# Patient Record
Sex: Female | Born: 2000
Health system: Southern US, Community
[De-identification: ages and names within clinical notes are randomized; demographics above are authoritative.]

---
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
## 2019-01-29 IMAGING — CR DG CHEST 2V
3 series · 3 of 3 positions shown · non-contrast
Comparison: None.

CLINICAL DATA: Coughing for 4 weeks.  Shortness of breath.

EXAM:
CHEST - 2 VIEW

[chest pa (1 of 2)]
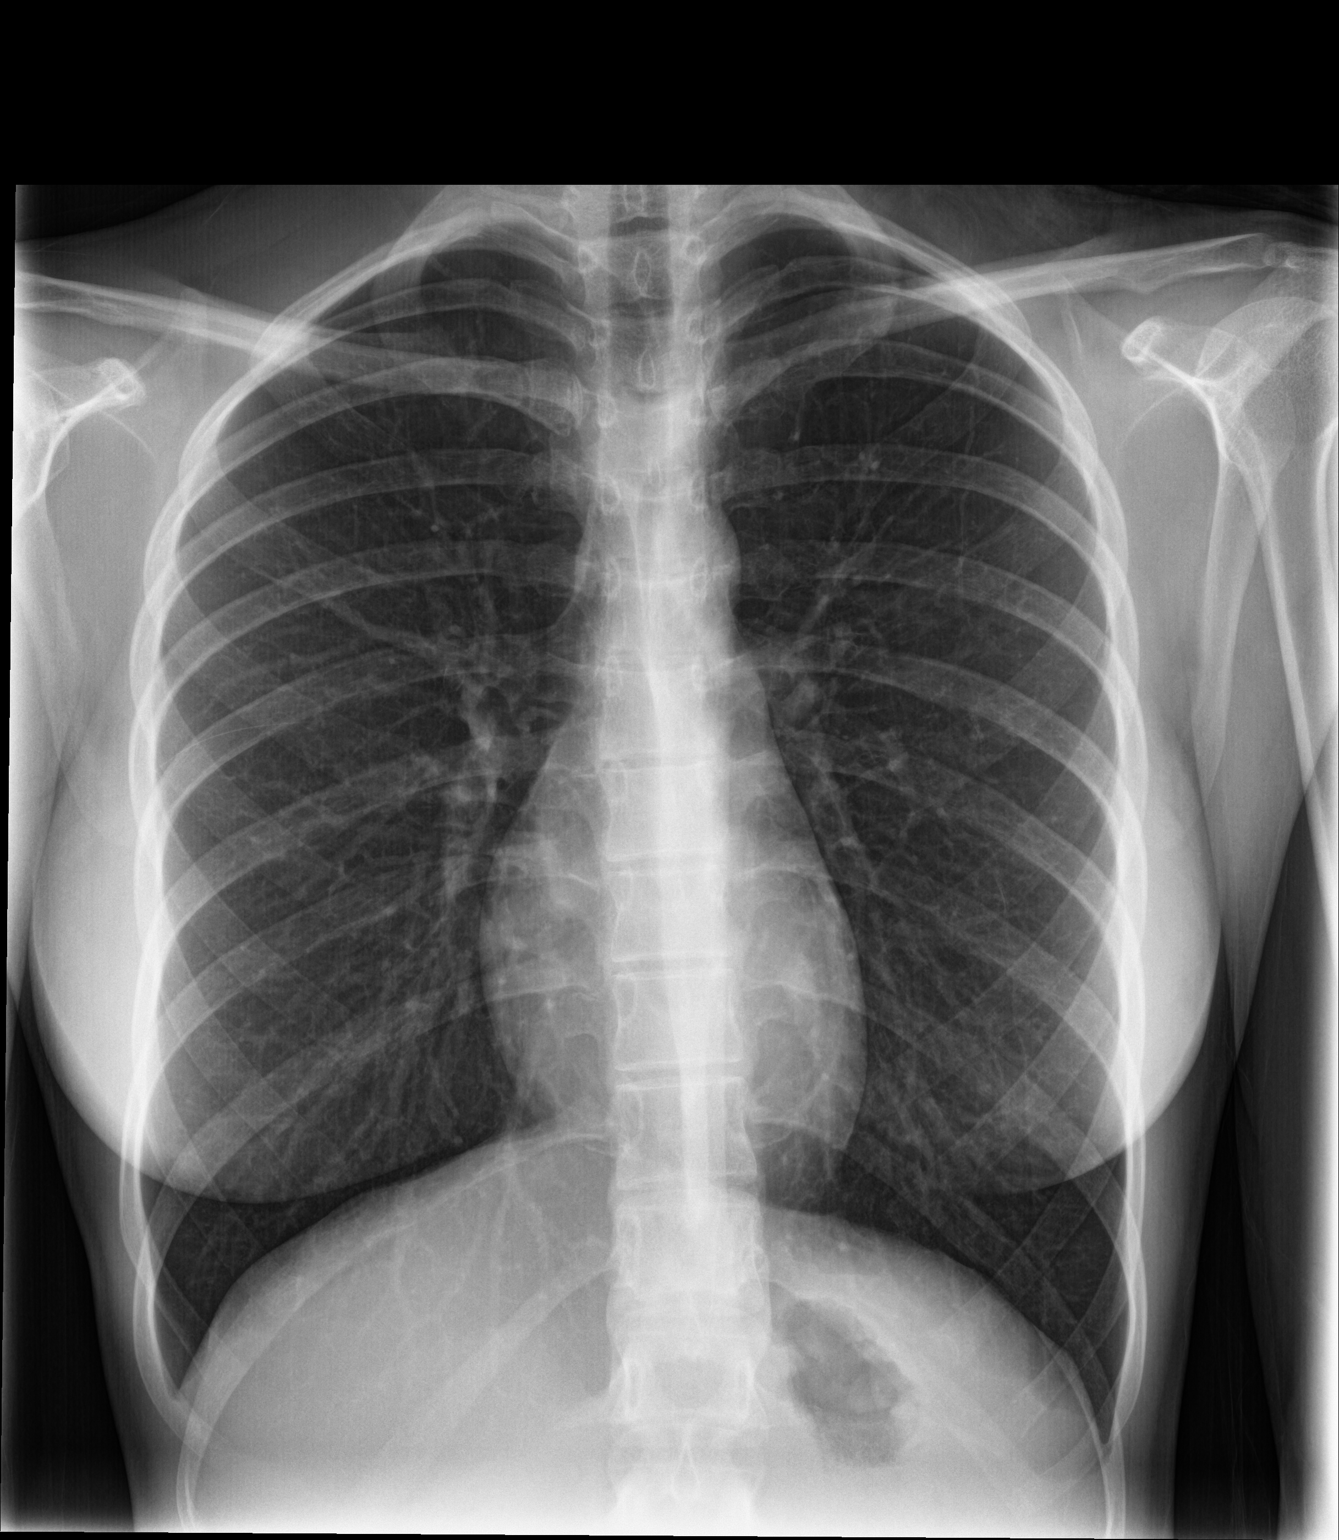

[chest lat]
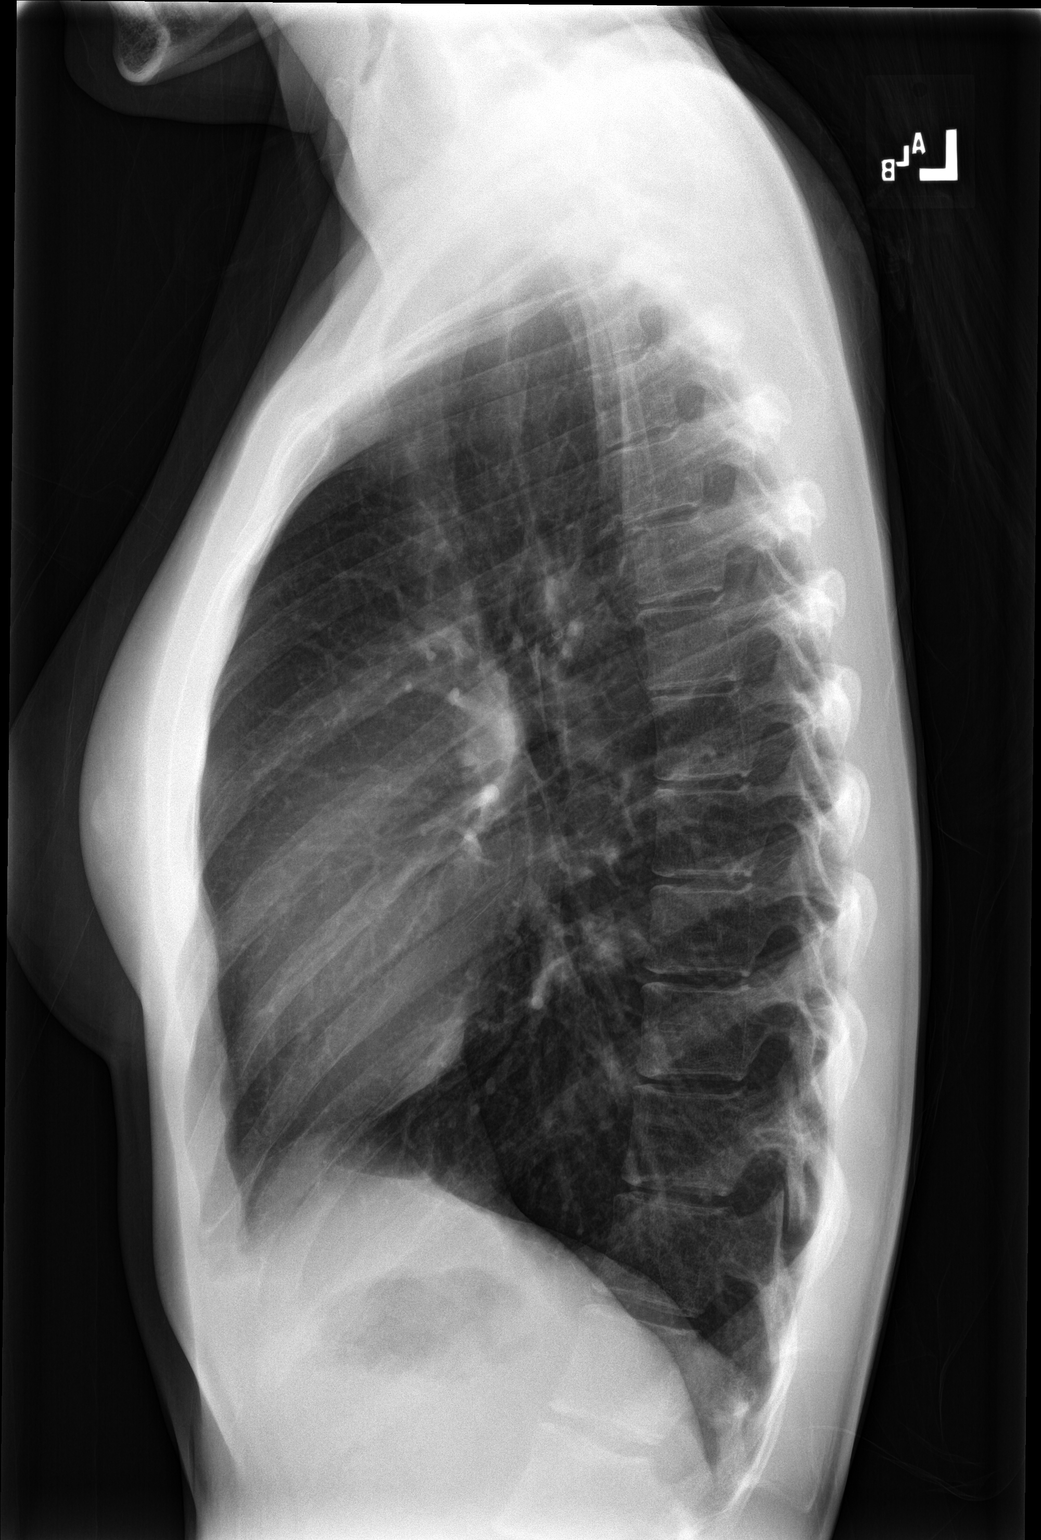

[chest pa (2 of 2)]
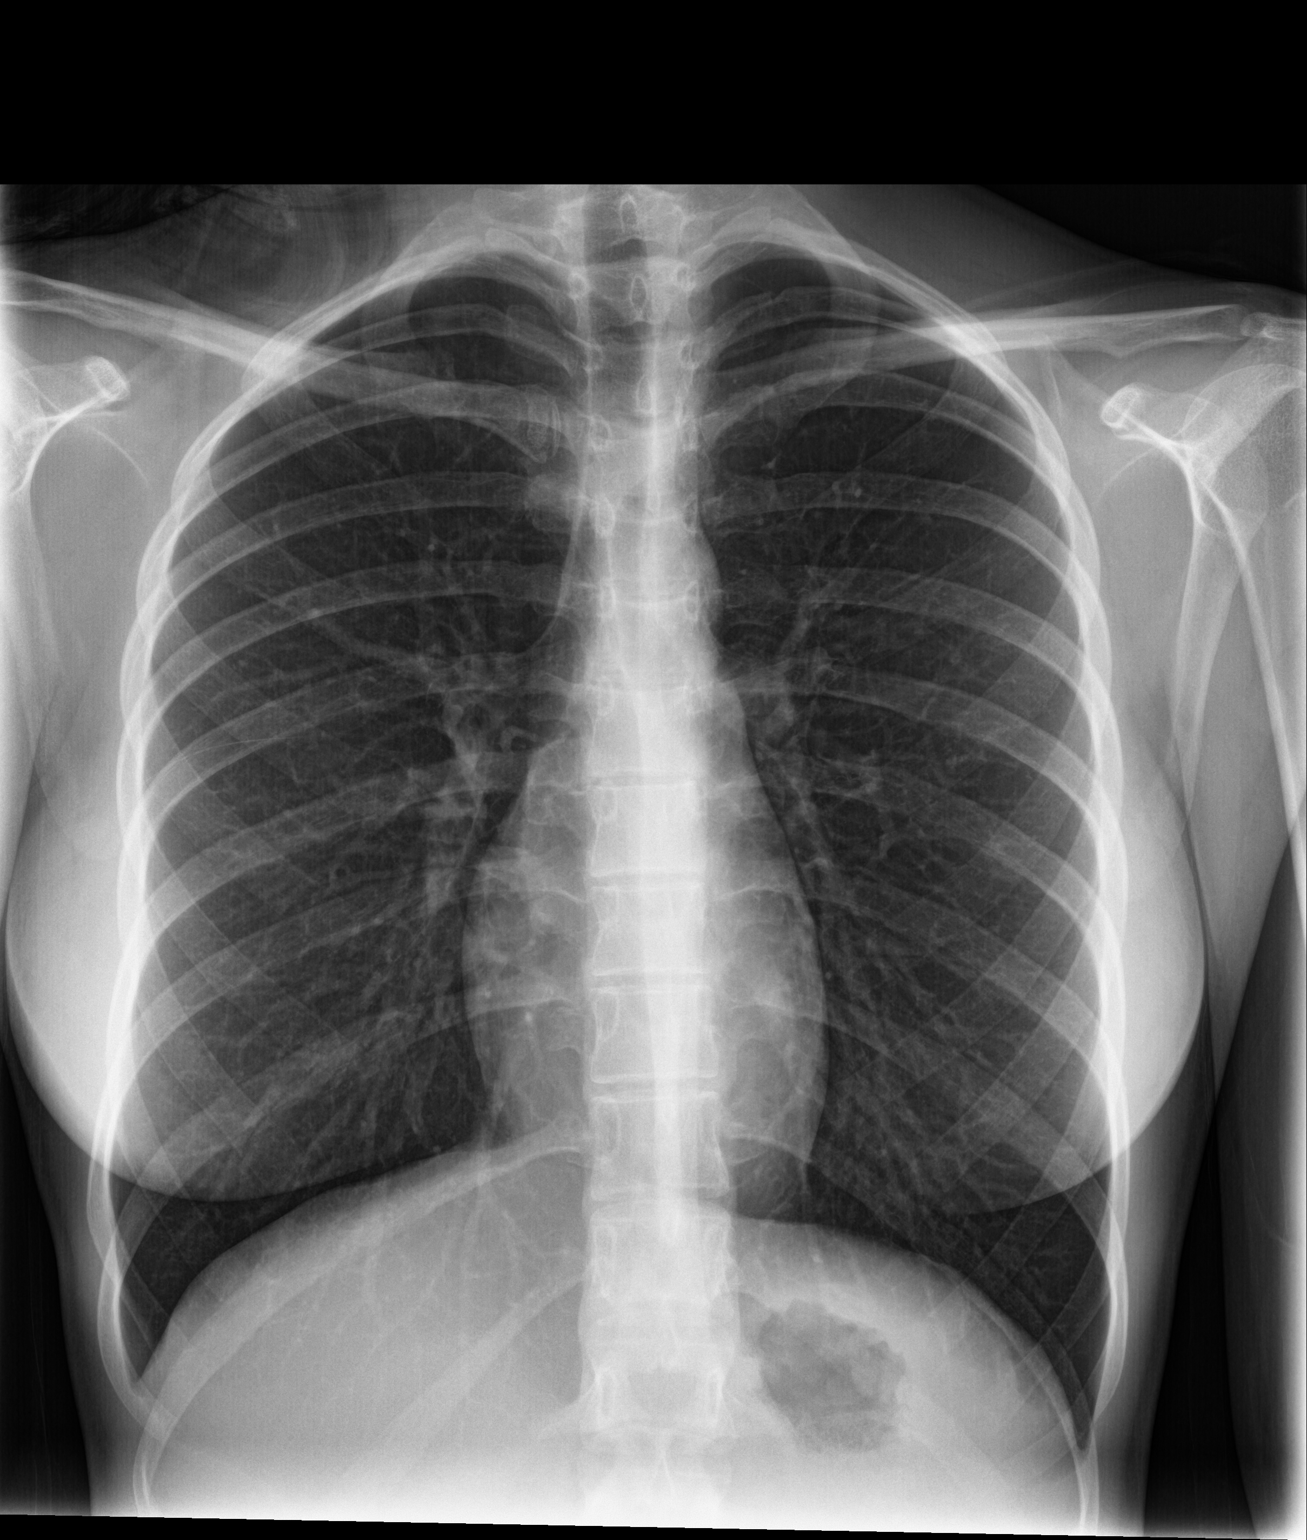

[3 of 3 positions shown; findings below may reference images not displayed]

FINDINGS: The heart size and mediastinal contours are within normal limits.
Both lungs are clear. The visualized skeletal structures are
unremarkable.
IMPRESSION: No active cardiopulmonary disease.

## 2020-08-27 ENCOUNTER — Ambulatory Visit (INDEPENDENT_AMBULATORY_CARE_PROVIDER_SITE_OTHER): Payer: BLUE CROSS/BLUE SHIELD | Admitting: Dermatology

## 2020-08-27 ENCOUNTER — Other Ambulatory Visit: Payer: Self-pay

## 2020-08-27 DIAGNOSIS — Z86018 Personal history of other benign neoplasm: Secondary | ICD-10-CM | POA: Diagnosis not present

## 2020-08-27 DIAGNOSIS — D229 Melanocytic nevi, unspecified: Secondary | ICD-10-CM

## 2020-08-27 DIAGNOSIS — Z1283 Encounter for screening for malignant neoplasm of skin: Secondary | ICD-10-CM | POA: Diagnosis not present

## 2020-08-27 DIAGNOSIS — L578 Other skin changes due to chronic exposure to nonionizing radiation: Secondary | ICD-10-CM

## 2020-08-27 DIAGNOSIS — L700 Acne vulgaris: Secondary | ICD-10-CM | POA: Diagnosis not present

## 2020-08-27 NOTE — Progress Notes (Signed)
   Follow-Up Visit   Subjective  Jacqueline Pruitt is a 19 y.o. female who presents for the following: TBSE (Total body skin exam, hx of dysplastic nevi) and Acne (Face, Dapsone 7.5% and Veltin prn). The patient presents for Total-Body Skin Exam (TBSE) for skin cancer screening and mole check.  The following portions of the chart were reviewed this encounter and updated as appropriate:   Tobacco  Allergies  Meds  Problems  Med Hx  Surg Hx  Fam Hx     Review of Systems:  No other skin or systemic complaints except as noted in HPI or Assessment and Plan.  Objective  Well appearing patient in no apparent distress; mood and affect are within normal limits.  A full examination was performed including scalp, head, eyes, ears, nose, lips, neck, chest, axillae, abdomen, back, buttocks, bilateral upper extremities, bilateral lower extremities, hands, feet, fingers, toes, fingernails, and toenails. All findings within normal limits unless otherwise noted below.  Objective  face: Few hyperpigmented macules scattered over lower face, moderate inflamed comedones over shoulders and back   Assessment & Plan    History of Dysplastic Nevi - No evidence of recurrence today - Recommend regular full body skin exams - Recommend daily broad spectrum sunscreen SPF 30+ to sun-exposed areas, reapply every 2 hours as needed.  - Call if any new or changing lesions are noted between office visits  Melanocytic Nevi - Tan-brown and/or pink-flesh-colored symmetric macules and papules - Benign appearing on exam today - Observation - Call clinic for new or changing moles - Recommend daily use of broad spectrum spf 30+ sunscreen to sun-exposed areas.   Actinic Damage - Chronic, secondary to cumulative UV/sun exposure - diffuse scaly erythematous macules with underlying dyspigmentation - Recommend daily broad spectrum sunscreen SPF 30+ to sun-exposed areas, reapply every 2 hours as needed.  - Call for  new or changing lesions.  Skin cancer screening performed today.  Acne vulgaris face Chronic; Persistent, not to goal  Recommend Winlevi qhs, samples given Lot [id number] 08/2021 Cont Veltin or Tretinoin qhs Cont Dapsone 7.5% gel qd  Return in about 1 year (around 08/27/2021) for TBSE, Hx of Dysplastic nevi.  I, Othelia Pulling, RMA, am acting as scribe for Sarina Ser, MD .  Documentation: I have reviewed the above documentation for accuracy and completeness, and I agree with the above.  Sarina Ser, MD

## 2020-08-27 NOTE — Patient Instructions (Signed)
Acne treatment for face and back  Start Winlevi pea size amount at night Continue Veltin alternating with Tretinoin pea size amount at night Continue Dapsone 7.5% gel in the morning

## 2020-09-01 ENCOUNTER — Encounter: Payer: Self-pay | Admitting: Dermatology

## 2020-09-17 ENCOUNTER — Encounter: Payer: 59 | Admitting: Dermatology

## 2021-09-01 ENCOUNTER — Other Ambulatory Visit: Payer: Self-pay

## 2021-09-01 ENCOUNTER — Ambulatory Visit (INDEPENDENT_AMBULATORY_CARE_PROVIDER_SITE_OTHER): Payer: BC Managed Care – PPO | Admitting: Dermatology

## 2021-09-01 DIAGNOSIS — B360 Pityriasis versicolor: Secondary | ICD-10-CM

## 2021-09-01 DIAGNOSIS — L821 Other seborrheic keratosis: Secondary | ICD-10-CM

## 2021-09-01 DIAGNOSIS — D1800 Hemangioma unspecified site: Secondary | ICD-10-CM

## 2021-09-01 DIAGNOSIS — Z86018 Personal history of other benign neoplasm: Secondary | ICD-10-CM

## 2021-09-01 DIAGNOSIS — Z1283 Encounter for screening for malignant neoplasm of skin: Secondary | ICD-10-CM | POA: Diagnosis not present

## 2021-09-01 DIAGNOSIS — L578 Other skin changes due to chronic exposure to nonionizing radiation: Secondary | ICD-10-CM

## 2021-09-01 DIAGNOSIS — D229 Melanocytic nevi, unspecified: Secondary | ICD-10-CM

## 2021-09-01 DIAGNOSIS — L700 Acne vulgaris: Secondary | ICD-10-CM

## 2021-09-01 DIAGNOSIS — L814 Other melanin hyperpigmentation: Secondary | ICD-10-CM

## 2021-09-01 MED ORDER — KETOCONAZOLE 2 % EX SHAM: 1.0000 "application " | MEDICATED_SHAMPOO | Freq: Once | CUTANEOUS | 0 refills | Status: DC

## 2021-09-01 MED ORDER — DAPSONE 7.5 % EX GEL: 1.0000 "application " | Freq: Every day | CUTANEOUS | 11 refills | Status: DC

## 2021-09-01 MED ORDER — TRETINOIN 0.05 % EX CREA
TOPICAL_CREAM | Freq: Every day | CUTANEOUS | 11 refills | Status: DC
Start: 2021-09-01 — End: 2022-08-31

## 2021-09-01 MED ORDER — WINLEVI 1 % EX CREA: 1.0000 "application " | TOPICAL_CREAM | Freq: Every day | CUTANEOUS | 11 refills | Status: DC

## 2021-09-01 NOTE — Progress Notes (Signed)
Follow-Up Visit   Subjective  Jacqueline Pruitt is a 20 y.o. female who presents for the following: Annual Exam (History of dysplastic nevi - TBSE today). The patient presents for Total-Body Skin Exam (TBSE) for skin cancer screening and mole check.  The patient has spots, moles and lesions to be evaluated, some may be new or changing and the patient has concerns that these could be cancer. She has acne is currently using tretinoin and dapsone.  She has some persistent areas. She has a rash on her trunk.  The following portions of the chart were reviewed this encounter and updated as appropriate:   Tobacco   Allergies   Meds   Problems   Med Hx   Surg Hx   Fam Hx      Review of Systems:  No other skin or systemic complaints except as noted in HPI or Assessment and Plan.  Objective  Well appearing patient in no apparent distress; mood and affect are within normal limits.  A full examination was performed including scalp, head, eyes, ears, nose, lips, neck, chest, axillae, abdomen, back, buttocks, bilateral upper extremities, bilateral lower extremities, hands, feet, fingers, toes, fingernails, and toenails. All findings within normal limits unless otherwise noted below.  Head - Anterior (Face) ~10 small active papules of face. Mild inflamed comedones of shouders.   Assessment & Plan   History of Dysplastic Nevi - No evidence of recurrence today - Recommend regular full body skin exams - Recommend daily broad spectrum sunscreen SPF 30+ to sun-exposed areas, reapply every 2 hours as needed.  - Call if any new or changing lesions are noted between office visits  Lentigines - Scattered tan macules - Due to sun exposure - Benign-appearing, observe - Recommend daily broad spectrum sunscreen SPF 30+ to sun-exposed areas, reapply every 2 hours as needed. - Call for any changes  Seborrheic Keratoses - Stuck-on, waxy, tan-brown papules and/or plaques  - Benign-appearing - Discussed  benign etiology and prognosis. - Observe - Call for any changes  Melanocytic Nevi - Tan-brown and/or pink-flesh-colored symmetric macules and papules - Benign appearing on exam today - Observation - Call clinic for new or changing moles - Recommend daily use of broad spectrum spf 30+ sunscreen to sun-exposed areas.   Hemangiomas - Red papules - Discussed benign nature - Observe - Call for any changes  Actinic Damage - Chronic condition, secondary to cumulative UV/sun exposure - diffuse scaly erythematous macules with underlying dyspigmentation - Recommend daily broad spectrum sunscreen SPF 30+ to sun-exposed areas, reapply every 2 hours as needed.  - Staying in the shade or wearing long sleeves, sun glasses (UVA+UVB protection) and wide brim hats (4-inch brim around the entire circumference of the hat) are also recommended for sun protection.  - Call for new or changing lesions.  Skin cancer screening performed today.  Acne vulgaris Head - Anterior (Face) Chronic and persistent but improved on treatment.  Start Winlevi cream qhs Continue Dapsone gel qd,  Tretinoin qhs  tretinoin (RETIN-A) 0.05 % cream - Head - Anterior (Face) Apply topically at bedtime. Clascoterone (WINLEVI) 1 % CREA - Head - Anterior (Face) Apply 1 application topically daily. Dapsone 7.5 % GEL - Head - Anterior (Face) Apply 1 application topically daily.  Tinea versicolor Ketoconazole 2% shampoo - wash trunk 2-3 times per week for 2 months then monthly for prevention. Tinea versicolor is a chronic recurrent skin rash causing discolored scaly spots most commonly seen on back, chest, and/or shoulders.  It  is generally asymptomatic. The rash is due to overgrowth of a common type of yeast present on everyone's skin and it is not contagious.  It tends to flare more in the summer due to increased sweating on trunk.  After rash is treated, the scaliness will resolve, but the discoloration will take longer to  return to normal pigmentation. The periodic use of an OTC medicated soap/shampoo with zinc or selenium sulfide can be helpful to prevent yeast overgrowth and recurrence.  ketoconazole (NIZORAL) 2 % shampoo Apply 1 application topically once for 1 dose.  Skin cancer screening  Return in about 1 year (around 09/01/2022) for TBSE.  I, Ashok Cordia, CMA, am acting as scribe for Sarina Ser, MD . Documentation: I have reviewed the above documentation for accuracy and completeness, and I agree with the above.  Sarina Ser, MD

## 2021-09-01 NOTE — Patient Instructions (Signed)

## 2021-09-12 ENCOUNTER — Encounter: Payer: Self-pay | Admitting: Dermatology

## 2021-10-01 ENCOUNTER — Other Ambulatory Visit: Payer: Self-pay | Admitting: Dermatology

## 2021-10-01 DIAGNOSIS — B360 Pityriasis versicolor: Secondary | ICD-10-CM

## 2022-08-31 ENCOUNTER — Ambulatory Visit (INDEPENDENT_AMBULATORY_CARE_PROVIDER_SITE_OTHER): Payer: BC Managed Care – PPO | Admitting: Dermatology

## 2022-08-31 DIAGNOSIS — L821 Other seborrheic keratosis: Secondary | ICD-10-CM

## 2022-08-31 DIAGNOSIS — Z1283 Encounter for screening for malignant neoplasm of skin: Secondary | ICD-10-CM | POA: Diagnosis not present

## 2022-08-31 DIAGNOSIS — L700 Acne vulgaris: Secondary | ICD-10-CM | POA: Diagnosis not present

## 2022-08-31 DIAGNOSIS — D492 Neoplasm of unspecified behavior of bone, soft tissue, and skin: Secondary | ICD-10-CM

## 2022-08-31 DIAGNOSIS — D225 Melanocytic nevi of trunk: Secondary | ICD-10-CM | POA: Diagnosis not present

## 2022-08-31 DIAGNOSIS — Z79899 Other long term (current) drug therapy: Secondary | ICD-10-CM

## 2022-08-31 DIAGNOSIS — Z86018 Personal history of other benign neoplasm: Secondary | ICD-10-CM

## 2022-08-31 DIAGNOSIS — L814 Other melanin hyperpigmentation: Secondary | ICD-10-CM

## 2022-08-31 MED ORDER — TRETINOIN 0.05 % EX CREA: TOPICAL_CREAM | Freq: Every day | CUTANEOUS | 11 refills | Status: DC

## 2022-08-31 MED ORDER — DAPSONE 7.5 % EX GEL: 1.0000 "application " | Freq: Every day | CUTANEOUS | 11 refills | Status: DC

## 2022-08-31 NOTE — Patient Instructions (Signed)
Due to recent changes in healthcare laws, you may see results of your pathology and/or laboratory studies on MyChart before the doctors have had a chance to review them. We understand that in some cases there may be results that are confusing or concerning to you. Please understand that not all results are received at the same time and often the doctors may need to interpret multiple results in order to provide you with the best plan of care or course of treatment. Therefore, we ask that you please give us 2 business days to thoroughly review all your results before contacting the office for clarification. Should we see a critical lab result, you will be contacted sooner.   If You Need Anything After Your Visit  If you have any questions or concerns for your doctor, please call our main line at [phone] and press option 4 to reach your doctor's medical assistant. If no one answers, please leave a voicemail as directed and we will return your call as soon as possible. Messages left after 4 pm will be answered the following business day.   You may also send us a message via MyChart. We typically respond to MyChart messages within 1-2 business days.  For prescription refills, please ask your pharmacy to contact our office. Our fax number is [phone].  If you have an urgent issue when the clinic is closed that cannot wait until the next business day, you can page your doctor at the number below.    Please note that while we do our best to be available for urgent issues outside of office hours, we are not available 24/7.   If you have an urgent issue and are unable to reach us, you may choose to seek medical care at your doctor's office, retail clinic, urgent care center, or emergency room.  If you have a medical emergency, please immediately call 911 or go to the emergency department.  Pager Numbers  - Dr. Kowalski: [phone]  - Dr. Moye: [phone]  - Dr. Stewart:  [phone]  In the event of inclement weather, please call our main line at [phone] for an update on the status of any delays or closures.  Dermatology Medication Tips: Please keep the boxes that topical medications come in in order to help keep track of the instructions about where and how to use these. Pharmacies typically print the medication instructions only on the boxes and not directly on the medication tubes.   If your medication is too expensive, please contact our office at [phone] option 4 or send us a message through MyChart.   We are unable to tell what your co-pay for medications will be in advance as this is different depending on your insurance coverage. However, we may be able to find a substitute medication at lower cost or fill out paperwork to get insurance to cover a needed medication.   If a prior authorization is required to get your medication covered by your insurance company, please allow us 1-2 business days to complete this process.  Drug prices often vary depending on where the prescription is filled and some pharmacies may offer cheaper prices.  The website www.goodrx.com contains coupons for medications through different pharmacies. The prices here do not account for what the cost may be with help from insurance (it may be cheaper with your insurance), but the website can give you the price if you did not use any insurance.  - You can print the associated coupon and take it with   your prescription to the pharmacy.  - You may also stop by our office during regular business hours and pick up a GoodRx coupon card.  - If you need your prescription sent electronically to a different pharmacy, notify our office through [facility] MyChart or by phone at [phone] option 4.     Si Usted Necesita Algo Despus de Su Visita  Tambin puede enviarnos un mensaje a travs de MyChart. Por lo general respondemos a los mensajes de MyChart en el transcurso de 1 a 2  das hbiles.  Para renovar recetas, por favor pida a su farmacia que se ponga en contacto con nuestra oficina. Nuestro nmero de fax es el [phone].  Si tiene un asunto urgente cuando la clnica est cerrada y que no puede esperar hasta el siguiente da hbil, puede llamar/localizar a su doctor(a) al nmero que aparece a continuacin.   Por favor, tenga en cuenta que aunque hacemos todo lo posible para estar disponibles para asuntos urgentes fuera del horario de oficina, no estamos disponibles las 24 horas del da, los 7 das de la semana.   Si tiene un problema urgente y no puede comunicarse con nosotros, puede optar por buscar atencin mdica  en el consultorio de su doctor(a), en una clnica privada, en un centro de atencin urgente o en una sala de emergencias.  Si tiene una emergencia mdica, por favor llame inmediatamente al 911 o vaya a la sala de emergencias.  Nmeros de bper  - Dr. Kowalski: [phone]  - Dra. Moye: [phone]  - Dra. Stewart: [phone]  En caso de inclemencias del tiempo, por favor llame a nuestra lnea principal al [phone] para una actualizacin sobre el estado de cualquier retraso o cierre.  Consejos para la medicacin en dermatologa: Por favor, guarde las cajas en las que vienen los medicamentos de uso tpico para ayudarle a seguir las instrucciones sobre dnde y cmo usarlos. Las farmacias generalmente imprimen las instrucciones del medicamento slo en las cajas y no directamente en los tubos del medicamento.   Si su medicamento es muy caro, por favor, pngase en contacto con nuestra oficina llamando al [phone] y presione la opcin 4 o envenos un mensaje a travs de MyChart.   No podemos decirle cul ser su copago por los medicamentos por adelantado ya que esto es diferente dependiendo de la cobertura de su seguro. Sin embargo, es posible que podamos encontrar un medicamento sustituto a menor costo o llenar un formulario para que el  seguro cubra el medicamento que se considera necesario.   Si se requiere una autorizacin previa para que su compaa de seguros cubra su medicamento, por favor permtanos de 1 a 2 das hbiles para completar este proceso.  Los precios de los medicamentos varan con frecuencia dependiendo del lugar de dnde se surte la receta y alguna farmacias pueden ofrecer precios ms baratos.  El sitio web www.goodrx.com tiene cupones para medicamentos de diferentes farmacias. Los precios aqu no tienen en cuenta lo que podra costar con la ayuda del seguro (puede ser ms barato con su seguro), pero el sitio web puede darle el precio si no utiliz ningn seguro.  - Puede imprimir el cupn correspondiente y llevarlo con su receta a la farmacia.  - Tambin puede pasar por nuestra oficina durante el horario de atencin regular y recoger una tarjeta de cupones de GoodRx.  - Si necesita que su receta se enve electrnicamente a una farmacia diferente, informe a nuestra oficina a travs de MyChart de [facility]   o por telfono llamando al [phone] y presione la opcin 4.  

## 2022-08-31 NOTE — Progress Notes (Signed)
Follow-Up Visit   Subjective  Jacqueline Pruitt is a 21 y.o. female who presents for the following: Annual Exam (Mole check ) and Acne (Recheck acne on her face treating with Dapsone and Retin A .05% cream ). The patient presents for Total-Body Skin Exam (TBSE) for skin cancer screening and mole check.  The patient has spots, moles and lesions to be evaluated, some may be new or changing and the patient has concerns that these could be cancer.   Mother with patient   The following portions of the chart were reviewed this encounter and updated as appropriate:   Tobacco  Allergies  Meds  Problems  Med Hx  Surg Hx  Fam Hx     Review of Systems:  No other skin or systemic complaints except as noted in HPI or Assessment and Plan.  Objective  Well appearing patient in no apparent distress; mood and affect are within normal limits.  A full examination was performed including scalp, head, eyes, ears, nose, lips, neck, chest, axillae, abdomen, back, buttocks, bilateral upper extremities, bilateral lower extremities, hands, feet, fingers, toes, fingernails, and toenails. All findings within normal limits unless otherwise noted below.  face Small scattered papules on the face and shoulders   left epigastic 0.6 cm irregular brown macule        right mid lateral  back at side 0.6 cm irregular brown macule         Assessment & Plan  Acne vulgaris face Chronic and persistent condition with duration or expected duration over one year. Condition is symptomatic / bothersome to patient. Not to goal. Continue Tretinoin 0.0.5% cream apply to face, upper back and shoulders  Continue Dapsone gel apply to face, upper back and shoulders  May consider low dose Doxycyline in the future -patient declines oral treatment today.  Related Medications tretinoin (RETIN-A) 0.05 % cream Apply topically at bedtime. Dapsone 7.5 % GEL Apply 1 application  topically daily.  Neoplasm of skin  (2) left epigastic Epidermal / dermal shaving  Lesion diameter (cm):  0.6 Informed consent: discussed and consent obtained   Timeout: patient name, date of birth, surgical site, and procedure verified   Procedure prep:  Patient was prepped and draped in usual sterile fashion Prep type:  Isopropyl alcohol Anesthesia: the lesion was anesthetized in a standard fashion   Anesthetic:  1% lidocaine w/ epinephrine 1-100,000 buffered w/ 8.4% NaHCO3 Hemostasis achieved with: pressure, aluminum chloride and electrodesiccation   Outcome: patient tolerated procedure well   Post-procedure details: sterile dressing applied and wound care instructions given   Dressing type: bandage and petrolatum    Specimen 1 - Surgical pathology Differential Diagnosis: R/O Dysplastic nevus vs other  Check Margins: No  right mid lateral  back at side Epidermal / dermal shaving  Lesion diameter (cm):  0.6 Informed consent: discussed and consent obtained   Patient was prepped and draped in usual sterile fashion: area prepped with alcohol. Anesthesia: the lesion was anesthetized in a standard fashion   Anesthetic:  1% lidocaine w/ epinephrine 1-100,000 buffered w/ 8.4% NaHCO3 Instrument used: flexible razor blade   Hemostasis achieved with: pressure, aluminum chloride and electrodesiccation   Outcome: patient tolerated procedure well   Post-procedure details: wound care instructions given   Post-procedure details comment:  Ointment and small bandage applied  Specimen 2 - Surgical pathology Differential Diagnosis: R/O dysplastic nevus vs other  Check Margins: No  Lentigines - Scattered tan macules - Due to sun exposure - Benign-appearing, observe -  Recommend daily broad spectrum sunscreen SPF 30+ to sun-exposed areas, reapply every 2 hours as needed. - Call for any changes  Seborrheic Keratoses - Stuck-on, waxy, tan-brown papules and/or plaques  - Benign-appearing - Discussed benign etiology and  prognosis. - Observe - Call for any changes  Melanocytic Nevi - Tan-brown and/or pink-flesh-colored symmetric macules and papules - Benign appearing on exam today - Observation - Call clinic for new or changing moles - Recommend daily use of broad spectrum spf 30+ sunscreen to sun-exposed areas.   Hemangiomas - Red papules - Discussed benign nature - Observe - Call for any changes  History of Dysplastic Nevi Multiple see history  - No evidence of recurrence today - Recommend regular full body skin exams - Recommend daily broad spectrum sunscreen SPF 30+ to sun-exposed areas, reapply every 2 hours as needed.  - Call if any new or changing lesions are noted between office visits   Skin cancer screening performed today.   Return in about 1 year (around 09/01/2023) for TBSE, hx of Dysplastic nevus .  IMarye Round, CMA, am acting as scribe for Sarina Ser, MD .  Documentation: I have reviewed the above documentation for accuracy and completeness, and I agree with the above.  Sarina Ser, MD

## 2022-09-07 ENCOUNTER — Ambulatory Visit: Payer: BC Managed Care – PPO | Admitting: Dermatology

## 2022-09-10 ENCOUNTER — Encounter: Payer: Self-pay | Admitting: Dermatology

## 2022-09-13 ENCOUNTER — Telehealth: Payer: Self-pay

## 2022-09-13 NOTE — Telephone Encounter (Signed)
-----   Message from Ralene Bathe, MD sent at 09/09/2022  6:54 PM EST ----- Diagnosis 1. Skin , left epigastric DYSPLASTIC COMPOUND NEVUS WITH MILD ATYPIA, LIMITED MARGINS FREE 2. Skin , right mid lateral back at side DYSPLASTIC COMPOUND NEVUS WITH MILD ATYPIA, PERIPHERAL AND DEEP MARGINS INVOLVED  1&2 - both Mild dysplastic Recheck next visit

## 2022-09-13 NOTE — Telephone Encounter (Signed)
Patient advised pathology showed dysplastic nevi with mild atypia. Lurlean Horns., RMA

## 2023-08-31 ENCOUNTER — Ambulatory Visit: Payer: BC Managed Care – PPO | Admitting: Dermatology

## 2023-08-31 DIAGNOSIS — L700 Acne vulgaris: Secondary | ICD-10-CM

## 2023-08-31 DIAGNOSIS — D1801 Hemangioma of skin and subcutaneous tissue: Secondary | ICD-10-CM

## 2023-08-31 DIAGNOSIS — L578 Other skin changes due to chronic exposure to nonionizing radiation: Secondary | ICD-10-CM

## 2023-08-31 DIAGNOSIS — L814 Other melanin hyperpigmentation: Secondary | ICD-10-CM

## 2023-08-31 DIAGNOSIS — D492 Neoplasm of unspecified behavior of bone, soft tissue, and skin: Secondary | ICD-10-CM | POA: Diagnosis not present

## 2023-08-31 DIAGNOSIS — D485 Neoplasm of uncertain behavior of skin: Secondary | ICD-10-CM

## 2023-08-31 DIAGNOSIS — D225 Melanocytic nevi of trunk: Secondary | ICD-10-CM | POA: Diagnosis not present

## 2023-08-31 DIAGNOSIS — Z86018 Personal history of other benign neoplasm: Secondary | ICD-10-CM

## 2023-08-31 DIAGNOSIS — W908XXA Exposure to other nonionizing radiation, initial encounter: Secondary | ICD-10-CM

## 2023-08-31 DIAGNOSIS — L821 Other seborrheic keratosis: Secondary | ICD-10-CM

## 2023-08-31 DIAGNOSIS — D229 Melanocytic nevi, unspecified: Secondary | ICD-10-CM

## 2023-08-31 DIAGNOSIS — Z1283 Encounter for screening for malignant neoplasm of skin: Secondary | ICD-10-CM | POA: Diagnosis not present

## 2023-08-31 DIAGNOSIS — Z7189 Other specified counseling: Secondary | ICD-10-CM

## 2023-08-31 DIAGNOSIS — Z79899 Other long term (current) drug therapy: Secondary | ICD-10-CM

## 2023-08-31 MED ORDER — TRETINOIN 0.05 % EX CREA: TOPICAL_CREAM | Freq: Every day | CUTANEOUS | 11 refills | Status: DC

## 2023-08-31 MED ORDER — DAPSONE 7.5 % EX GEL: 1.0000 | Freq: Every day | CUTANEOUS | 11 refills | Status: AC

## 2023-08-31 NOTE — Patient Instructions (Addendum)

## 2023-08-31 NOTE — Progress Notes (Signed)
Follow-Up Visit   Subjective  Jacqueline Pruitt is a 22 y.o. female who presents for the following: Skin Cancer Screening and Full Body Skin Exam  The patient presents for Total-Body Skin Exam (TBSE) for skin cancer screening and mole check. The patient has spots, moles and lesions to be evaluated, some may be new or changing and the patient may have concern these could be cancer.  The following portions of the chart were reviewed this encounter and updated as appropriate: medications, allergies, medical history  Review of Systems:  No other skin or systemic complaints except as noted in HPI or Assessment and Plan.  Objective  Well appearing patient in no apparent distress; mood and affect are within normal limits.  A full examination was performed including scalp, head, eyes, ears, nose, lips, neck, chest, axillae, abdomen, back, buttocks, bilateral upper extremities, bilateral lower extremities, hands, feet, fingers, toes, fingernails, and toenails. All findings within normal limits unless otherwise noted below.   Relevant physical exam findings are noted in the Assessment and Plan.  R sup lat buttocks at the lat waistline 0.7 cm irregular brown macule.  Assessment & Plan   SKIN CANCER SCREENING PERFORMED TODAY.  ACTINIC DAMAGE - Chronic condition, secondary to cumulative UV/sun exposure - diffuse scaly erythematous macules with underlying dyspigmentation - Recommend daily broad spectrum sunscreen SPF 30+ to sun-exposed areas, reapply every 2 hours as needed.  - Staying in the shade or wearing long sleeves, sun glasses (UVA+UVB protection) and wide brim hats (4-inch brim around the entire circumference of the hat) are also recommended for sun protection.  - Call for new or changing lesions.  LENTIGINES, SEBORRHEIC KERATOSES, HEMANGIOMAS - Benign normal skin lesions - Benign-appearing - Call for any changes  MELANOCYTIC NEVI - Tan-brown and/or pink-flesh-colored symmetric  macules and papules - Benign appearing on exam today - Observation - Call clinic for new or changing moles - Recommend daily use of broad spectrum spf 30+ sunscreen to sun-exposed areas.   HISTORY OF DYSPLASTIC NEVI No evidence of recurrence today Recommend regular full body skin exams Recommend daily broad spectrum sunscreen SPF 30+ to sun-exposed areas, reapply every 2 hours as needed.  Call if any new or changing lesions are noted between office visits  ACNE VULGARIS - persistent but improved Exam: Open comedones and inflammatory papules of the face Chronic and persistent condition with duration or expected duration over one year. Condition is improving with treatment but not currently at goal. Treatment Plan: Continue Tretinoin 0.05% cream QHS and Dapsone 7.5% gel QAM Patient declines more aggressive treatment today.  ACNE VULGARIS   Related Medications tretinoin (RETIN-A) 0.05 % cream Apply topically at bedtime. Dapsone 7.5 % GEL Apply 1 application  topically daily. NEOPLASM OF UNCERTAIN BEHAVIOR OF SKIN R sup lat buttocks at the lat waistline Epidermal / dermal shaving  Lesion diameter (cm):  0.7 Informed consent: discussed and consent obtained   Timeout: patient name, date of birth, surgical site, and procedure verified   Procedure prep:  Patient was prepped and draped in usual sterile fashion Prep type:  Isopropyl alcohol Anesthesia: the lesion was anesthetized in a standard fashion   Anesthetic:  1% lidocaine w/ epinephrine 1-100,000 buffered w/ 8.4% NaHCO3 Instrument used: flexible razor blade   Hemostasis achieved with: pressure, aluminum chloride and electrodesiccation   Outcome: patient tolerated procedure well   Post-procedure details: sterile dressing applied and wound care instructions given   Dressing type: bandage and petrolatum   Specimen 1 - Surgical pathology  Differential Diagnosis: D48.5 r/o dysplastic nevus Check Margins: Yes Return in about 1  year (around 08/30/2024) for TBSE.  Maylene Roes, CMA, am acting as scribe for Armida Sans, MD .  Documentation: I have reviewed the above documentation for accuracy and completeness, and I agree with the above.  Armida Sans, MD

## 2023-09-05 LAB — SURGICAL PATHOLOGY

## 2023-09-10 ENCOUNTER — Encounter: Payer: Self-pay | Admitting: Dermatology

## 2023-09-11 ENCOUNTER — Telehealth: Payer: Self-pay

## 2023-09-11 NOTE — Telephone Encounter (Addendum)
Called and discussed results with patient. She verbalized understanding and denied further questions. Patient instructed to call if she noticed any changes with area before her next scheduled follow up and we could check area sooner.   ----- Message from Jacqueline Pruitt sent at 09/08/2023  2:16 PM EST ----- FINAL DIAGNOSIS        1. Skin, right superior lateral buttocks at the lateral waistline :       DYSPLASTIC COMPOUND NEVUS WITH MODERATE ATYPIA, DEEP MARGIN INVOLVED   Moderate dysplastic Recheck next visit

## 2024-04-12 ENCOUNTER — Ambulatory Visit
Admission: EM | Admit: 2024-04-12 | Discharge: 2024-04-12 | Disposition: A | Discharge status: Home / Self Care | Attending: Physician Assistant | Admitting: Physician Assistant

## 2024-04-12 ENCOUNTER — Encounter: Payer: Self-pay | Admitting: Emergency Medicine

## 2024-04-12 DIAGNOSIS — B349 Viral infection, unspecified: Secondary | ICD-10-CM

## 2024-04-12 DIAGNOSIS — R0981 Nasal congestion: Secondary | ICD-10-CM | POA: Diagnosis not present

## 2024-04-12 DIAGNOSIS — H6122 Impacted cerumen, left ear: Secondary | ICD-10-CM | POA: Diagnosis not present

## 2024-04-12 MED ORDER — PSEUDOEPH-BROMPHEN-DM 30-2-10 MG/5ML PO SYRP: 10.0000 mL | ORAL_SOLUTION | Freq: Four times a day (QID) | ORAL | 0 refills | Status: AC | PRN

## 2024-04-12 MED ORDER — IPRATROPIUM BROMIDE 0.06 % NA SOLN: 2.0000 | Freq: Four times a day (QID) | NASAL | 0 refills | Status: AC

## 2024-04-12 NOTE — Discharge Instructions (Addendum)
-  Continued COVID symptoms. You may be ill for another 1-2 weeks -Try over the counter Debrox drops in the left ear to soften the wax -I sent prescriptions to help dry up the fluid and relieve the sinus pressure. You may also use nasal saline rinses, tylenol and Motrin -Return if fever, not feeling better in another week, chest pain or shortness of breath

## 2024-04-12 NOTE — ED Triage Notes (Signed)
 Patient states that she was diagnosed with COVID 7 days ago.  Patient reports headaches, sinus pressure and congestion that got worse over the past 5 days.  Patient denies recent fevers.

## 2024-04-12 NOTE — ED Provider Notes (Signed)
 [facility]    CSN: [id number] Arrival date & time: 04/12/24  0806      History   Chief Complaint Chief Complaint  Patient presents with   Covid Positive   Sinus Problem    HPI Jacqueline Pruitt is a 23 y.o. female presenting for 1 week history of illness. Reports nasal congestion, sinus pressure, headaches, fatigue, and coughing. No fever, aches, sore throat, chest pain or shortness of breath. Symptoms worsened over the past few days. Reports left sided intermittent ear pain and increased sinus pressure. No discomfort at this time. Positive at home COVID test a few days ago. Has not been vaccinated for COVID 19. Not taking any OTC meds.  HPI  Past Medical History:  Diagnosis Date   Dysplastic nevus 02/26/2008   Right post auricular mastoid area. Moderate to marked atypia, edge involved. Excised 04/28/2008    Dysplastic nevus 05/05/2009   Crown scalp. Moderate atypia, close to margin.   Dysplastic nevus 05/05/2009   Left medial scapula 2cm lateral to spine. Moderate atypia, margins involved.    Dysplastic nevus 05/04/2010   Left ant. vertex. Mild to moderate atypia, margins involved.   Dysplastic nevus 07/19/2017   Left infrascapular mid back. Mild atypia, limited margins free.   Dysplastic nevus 08/31/2022   left epigastric, mild atypia   Dysplastic nevus 08/31/2022   right mid lateral back at side, mild atypia   Dysplastic nevus 08/31/2023   right superior lateral buttocks at the lateral waistline moderate atypia deep margin involved   Hx of hydrocephalus 2001   [facility] at [facility] 08/25/[facility]    There are no active problems to display for this patient.   Past Surgical History:  Procedure Laterality Date   SHUNT REMOVAL  08/2009   [facility]   VENTRICULOPERITONEAL SHUNT  08/25/2000   [facility]    OB History   No obstetric history on file.      Home Medications    Prior to Admission medications   Medication Sig Start Date End Date Taking? Authorizing  Provider  brompheniramine-pseudoephedrine-DM 30-2-10 MG/5ML syrup Take 10 mLs by mouth 4 (four) times daily as needed for up to 7 days. 04/12/24 04/19/24 Yes Arvis Huxley B, PA-C  ipratropium (ATROVENT) 0.06 % nasal spray Place 2 sprays into both nostrils 4 (four) times daily. 04/12/24  Yes Arvis Huxley NOVAK, PA-C  Dapsone  7.5 % GEL Apply 1 application  topically daily. 08/31/23   Hester Alm BROCKS, MD  ketoconazole  (NIZORAL ) 2 % shampoo APPLY TOPICALLY 1 APPLICATION ONCE FOR 1 DOSE Patient not taking: Reported on 08/31/2023 10/01/21   Hester Alm BROCKS, MD  tretinoin  (RETIN-A ) 0.05 % cream Apply topically at bedtime. 08/31/23 08/30/24  Hester Alm BROCKS, MD    Family History Family History  Problem Relation Age of Onset   Clotting disorder Mother    Asthma Brother    Heart disease Maternal Grandmother    Hypertension Maternal Grandmother    Breast cancer Maternal Grandmother    Ovarian cancer Maternal Grandmother    Heart disease Maternal Grandfather    Prostate cancer Maternal Grandfather     Social History Social History   Tobacco Use   Smoking status: Never   Smokeless tobacco: Never  Vaping Use   Vaping status: Never Used  Substance Use Topics   Alcohol use: Never   Drug use: Never     Allergies   Latex   Review of Systems Review of Systems  Constitutional:  Positive for fatigue. Negative  for chills, diaphoresis and fever.  HENT:  Positive for congestion, ear pain, rhinorrhea and sinus pressure. Negative for sinus pain and sore throat.   Respiratory:  Positive for cough. Negative for shortness of breath.   Cardiovascular:  Negative for chest pain.  Gastrointestinal:  Negative for abdominal pain, nausea and vomiting.  Musculoskeletal:  Negative for arthralgias and myalgias.  Skin:  Negative for rash.  Neurological:  Positive for headaches. Negative for weakness.  Hematological:  Negative for adenopathy.     Physical Exam Triage Vital Signs ED Triage Vitals   Encounter Vitals Group     BP      Girls Systolic BP Percentile      Girls Diastolic BP Percentile      Boys Systolic BP Percentile      Boys Diastolic BP Percentile      Pulse      Resp      Temp      Temp src      SpO2      Weight      Height      Head Circumference      Peak Flow      Pain Score      Pain Loc      Pain Education      Exclude from Growth Chart    No data found.  Updated Vital Signs BP 110/73 (BP Location: Right Arm)   Pulse 84   Temp 98.7 F (37.1 C) (Oral)   Resp 14   Ht 5' 2 (1.575 m)   Wt 110 lb (49.9 kg)   LMP 04/05/2024 (Exact Date)   SpO2 97%   BMI 20.12 kg/m   Physical Exam Vitals and nursing note reviewed.  Constitutional:      General: She is not in acute distress.    Appearance: Normal appearance. She is not ill-appearing or toxic-appearing.  HENT:     Head: Normocephalic and atraumatic.     Right Ear: Tympanic membrane, ear canal and external ear normal.     Left Ear: There is impacted cerumen.     Nose: Congestion present.     Mouth/Throat:     Mouth: Mucous membranes are moist.     Pharynx: Oropharynx is clear.  Eyes:     General: No scleral icterus.       Right eye: No discharge.        Left eye: No discharge.     Conjunctiva/sclera: Conjunctivae normal.  Cardiovascular:     Rate and Rhythm: Normal rate and regular rhythm.     Heart sounds: Normal heart sounds.  Pulmonary:     Effort: Pulmonary effort is normal. No respiratory distress.     Breath sounds: Normal breath sounds.  Musculoskeletal:     Cervical back: Neck supple.  Skin:    General: Skin is dry.  Neurological:     General: No focal deficit present.     Mental Status: She is alert. Mental status is at baseline.     Motor: No weakness.     Gait: Gait normal.  Psychiatric:        Mood and Affect: Mood normal.        Behavior: Behavior normal.      UC Treatments / Results  Labs (all labs ordered are listed, but only abnormal results are  displayed) Labs Reviewed - No data to display  EKG   Radiology No results found.  Procedures Procedures (including critical care time)  Medications  Ordered in UC Medications - No data to display  Initial Impression / Assessment and Plan / UC Course  I have reviewed the triage vital signs and the nursing notes.  Pertinent labs & imaging results that were available during my care of the patient were reviewed by me and considered in my medical decision making (see chart for details).   24 year old female presents for 1 week history of congestion, cough, sinus pressure and fatigue.  Symptoms got worse over the past couple days.  Has had intermittent left-sided ear pain increased sinus pressure.  Positive at home COVID test few days ago.  Not vaccinated for COVID-19.  Not taking any OTC meds.  Vitals are all stable and normal and she is overall well-appearing.  No acute distress.  On exam has no evidence of ear infection of the right ear.  Mild cerumen impaction of left EAC, nasal congestion.  Throat clear.  Chest clear.  Heart regular rate and rhythm.  Advised patient symptoms consistent with continued COVID symptoms.  Supportive care encouraged.  Patient not taking any OTC meds.  Encouraged her to take the prescribed nasal spray and decongestant to help with her sinus pressure.  Advised over-the-counter Debrox drops to loosen earwax.  Advised symptoms may last a couple more weeks and encouraged her to return if fever or worsening symptoms.   Final Clinical Impressions(s) / UC Diagnoses   Final diagnoses:  Viral illness  Nasal congestion  Impacted cerumen, left ear     Discharge Instructions      -Continued COVID symptoms. You may be ill for another 1-2 weeks -Try over the counter Debrox drops in the left ear to soften the wax -I sent prescriptions to help dry up the fluid and relieve the sinus pressure. You may also use nasal saline rinses, tylenol and Motrin -Return if fever,  not feeling better in another week, chest pain or shortness of breath     ED Prescriptions     Medication Sig Dispense Auth. Provider   ipratropium (ATROVENT) 0.06 % nasal spray Place 2 sprays into both nostrils 4 (four) times daily. 15 mL Arvis Huxley B, PA-C   brompheniramine-pseudoephedrine-DM 30-2-10 MG/5ML syrup Take 10 mLs by mouth 4 (four) times daily as needed for up to 7 days. 150 mL Arvis Huxley NOVAK, PA-C      PDMP not reviewed this encounter.   Arvis Huxley NOVAK, PA-C 04/12/24 [phone]

## 2024-04-28 ENCOUNTER — Ambulatory Visit: Payer: Self-pay | Admitting: Emergency Medicine

## 2024-04-28 ENCOUNTER — Ambulatory Visit (INDEPENDENT_AMBULATORY_CARE_PROVIDER_SITE_OTHER)

## 2024-04-28 ENCOUNTER — Ambulatory Visit
Admission: EM | Admit: 2024-04-28 | Discharge: 2024-04-28 | Disposition: A | Discharge status: Home / Self Care | Attending: Emergency Medicine | Admitting: Emergency Medicine

## 2024-04-28 ENCOUNTER — Encounter: Payer: Self-pay | Admitting: Emergency Medicine

## 2024-04-28 DIAGNOSIS — R051 Acute cough: Secondary | ICD-10-CM

## 2024-04-28 DIAGNOSIS — J22 Unspecified acute lower respiratory infection: Secondary | ICD-10-CM

## 2024-04-28 MED ORDER — PREDNISONE 20 MG PO TABS: 40.0000 mg | ORAL_TABLET | Freq: Every day | ORAL | 0 refills | Status: AC

## 2024-04-28 MED ORDER — IPRATROPIUM-ALBUTEROL 0.5-2.5 (3) MG/3ML IN SOLN
3.0000 mL | Freq: Once | RESPIRATORY_TRACT | Status: AC
  Administered 2024-04-28: 3 mL via RESPIRATORY_TRACT

## 2024-04-28 MED ORDER — AZITHROMYCIN 500 MG PO TABS: 500.0000 mg | ORAL_TABLET | Freq: Every day | ORAL | 0 refills | Status: AC

## 2024-04-28 MED ORDER — AEROCHAMBER MV MISC: 1 refills | Status: AC

## 2024-04-28 MED ORDER — ALBUTEROL SULFATE HFA 108 (90 BASE) MCG/ACT IN AERS: 2.0000 | INHALATION_SPRAY | RESPIRATORY_TRACT | 0 refills | Status: AC | PRN

## 2024-04-28 MED ORDER — HYDROCOD POLI-CHLORPHE POLI ER 10-8 MG/5ML PO SUER: 5.0000 mL | Freq: Two times a day (BID) | ORAL | 0 refills | Status: AC | PRN

## 2024-04-28 NOTE — Discharge Instructions (Signed)
 Radiology did not see pneumonia on x-ray, but not all pneumonia show up on x-ray.  Because you got acutely worse.  Tussionex for cough.  2 puffs from your albuterol  inhaler every 4 hours for 2 days, then every 6 hours for 2 days, then as needed.  You can back off any albuterol  if you start to improve sooner.  Finish prednisone  unless a medical provider tells you to stop.  Go to the ER if you get worse.

## 2024-04-28 NOTE — ED Triage Notes (Signed)
 Pt c/o cough, chest heaviness, and fever. She states she had covid about 2 weeks ago and the cough never went away. Cough got worse last night.

## 2024-04-28 NOTE — ED Provider Notes (Signed)
 HPI  SUBJECTIVE:  Jacqueline Pruitt is a 23 y.o. female who presents with 3 weeks of nonproductive cough after having COVID.  States the cough got worse, becoming more intense and frequent last night with fevers Tmax 101 and chills.  She reports chest heaviness.  She was unable to sleep last night because of the cough.  No wheezing, chest pain, shortness of breath or dyspnea on exertion, nasal congestion, rhinorrhea, sinus pain or pressure, postnasal drip.  No calf pain, swelling, hemoptysis, exogenous estrogen use, surgery in the past 4 weeks, recent immobilization.  No antibiotics in the past 3 months.  No antipyretic in the past 6 hours.  She tried cough drops and tea with improvement in her symptoms.  No aggravating factors.  She has no past medical history specifically of pulmonary disease, smoking, PE, DVT.  LMP: 3 weeks ago.  Denies the possibility of being pregnant.  PCP: [facility] [place]    Past Medical History:  Diagnosis Date   Dysplastic nevus 02/26/2008   Right post auricular mastoid area. Moderate to marked atypia, edge involved. Excised 04/28/2008    Dysplastic nevus 05/05/2009   Crown scalp. Moderate atypia, close to margin.   Dysplastic nevus 05/05/2009   Left medial scapula 2cm lateral to spine. Moderate atypia, margins involved.    Dysplastic nevus 05/04/2010   Left ant. vertex. Mild to moderate atypia, margins involved.   Dysplastic nevus 07/19/2017   Left infrascapular mid back. Mild atypia, limited margins free.   Dysplastic nevus 08/31/2022   left epigastric, mild atypia   Dysplastic nevus 08/31/2022   right mid lateral back at side, mild atypia   Dysplastic nevus 08/31/2023   right superior lateral buttocks at the lateral waistline moderate atypia deep margin involved   Hx of hydrocephalus 2001   [facility] at [facility] 08/25/2000    Past Surgical History:  Procedure Laterality Date   SHUNT REMOVAL  08/2009   [facility]   VENTRICULOPERITONEAL SHUNT   08/25/2000   [facility]    Family History  Problem Relation Age of Onset   Clotting disorder Mother    Asthma Brother    Heart disease Maternal Grandmother    Hypertension Maternal Grandmother    Breast cancer Maternal Grandmother    Ovarian cancer Maternal Grandmother    Heart disease Maternal Grandfather    Prostate cancer Maternal Grandfather     Social History   Tobacco Use   Smoking status: Never   Smokeless tobacco: Never  Vaping Use   Vaping status: Never Used  Substance Use Topics   Alcohol use: Never   Drug use: Never    No current facility-administered medications for this encounter.  Current Outpatient Medications:    albuterol  (VENTOLIN  HFA) 108 (90 Base) MCG/ACT inhaler, Inhale 2 puffs into the lungs every 4 (four) hours as needed for wheezing or shortness of breath. Dispense with aerochamber, Disp: 1 each, Rfl: 0   azithromycin  (ZITHROMAX ) 500 MG tablet, Take 1 tablet (500 mg total) by mouth daily for 5 days., Disp: 5 tablet, Rfl: 0   chlorpheniramine-HYDROcodone (TUSSIONEX) 10-8 MG/5ML, Take 5 mLs by mouth every 12 (twelve) hours as needed for cough., Disp: 60 mL, Rfl: 0   predniSONE  (DELTASONE ) 20 MG tablet, Take 2 tablets (40 mg total) by mouth daily with breakfast for 5 days., Disp: 10 tablet, Rfl: 0   Spacer/Aero-Holding Chambers (AEROCHAMBER MV) inhaler, Use as instructed, Disp: 1 each, Rfl: 1   Dapsone  7.5 % GEL, Apply 1 application  topically daily., Disp: 60 g, Rfl: 11   ipratropium (ATROVENT ) 0.06 % nasal spray, Place 2 sprays into both nostrils 4 (four) times daily., Disp: 15 mL, Rfl: 0   ketoconazole  (NIZORAL ) 2 % shampoo, APPLY TOPICALLY 1 APPLICATION ONCE FOR 1 DOSE (Patient not taking: Reported on 08/31/2023), Disp: 120 mL, Rfl: 0   tretinoin  (RETIN-A ) 0.05 % cream, Apply topically at bedtime., Disp: 45 g, Rfl: 11  Allergies  Allergen Reactions   Latex      ROS  As noted in HPI.   Physical Exam  BP 109/72 (BP Location: Right Arm)   Pulse 96    Temp 98.7 F (37.1 C) (Oral)   Resp 16   Ht 5' 2 (1.575 m)   Wt 49.9 kg   LMP 04/07/2024 (Approximate)   SpO2 98%   BMI 20.12 kg/m   Constitutional: Well developed, well nourished, no acute distress Eyes: PERRL, EOMI, conjunctiva normal bilaterally HENT: Normocephalic, atraumatic,mucus membranes moist.  No nasal congestion.  No nasal, frontal sinus tenderness.  No postnasal drip Respiratory: Clear to auscultation bilaterally, no rales, no wheezing, no rhonchi.  Fair air movement.  No anterior, lateral chest wall tenderness Cardiovascular: Normal rate and rhythm, no murmurs, no gallops, no rubs GI: nondistended skin: No rash, skin intact Musculoskeletal: Calves symmetric, nontender, no edema Neurologic: Alert & oriented x 3, CN III-XII grossly intact, no motor deficits, sensation grossly intact Psychiatric: Speech and behavior appropriate   ED Course   Medications  ipratropium-albuterol  (DUONEB) 0.5-2.5 (3) MG/3ML nebulizer solution 3 mL (3 mLs Nebulization Given 04/28/24 0907)    Orders Placed This Encounter  Procedures   DG Chest 2 View    Standing Status:   Standing    Number of Occurrences:   1    Reason for Exam (SYMPTOM  OR DIAGNOSIS REQUIRED):   COVID 3 weeks ago, cough, fever last night rule out pneumonia   No results found for this or any previous visit (from the past 24 hours). DG Chest 2 View Result Date: 04/28/2024 EXAM: 2 VIEW(S) XRAY OF THE CHEST 04/28/2024 09:06:00 AM COMPARISON: None available. CLINICAL HISTORY: COVID 3 weeks ago, cough, fever last night rule out pneumonia. Pt c/o cough, chest heaviness, and fever. She states she had covid about 2 weeks ago and the cough never went away. Cough got worse last night. FINDINGS: LUNGS AND PLEURA: No focal pulmonary opacity. No pulmonary edema. No pleural effusion. No pneumothorax. HEART AND MEDIASTINUM: No acute abnormality of the cardiac and mediastinal silhouettes. BONES AND SOFT TISSUES: No acute osseous  abnormality. IMPRESSION: 1. No acute process. Electronically signed by: Lonni Necessary MD 04/28/2024 09:22 AM EDT RP Workstation: [id number]    ED Clinical Impression  1. Lower respiratory tract infection   2. Acute cough      ED Assessment/Plan    Patient with recent COVID, getting worse.  Concern for secondary pneumonia.  Doubt PE.  She is PERC negative.  Will check chest x-ray.  Will also give DuoNeb and reevaluate.  [place] Narcotic database reviewed for this patient, and feel that the risk/benefit ratio today is favorable for proceeding with a prescription for controlled substance.  No opiate prescriptions in the past 2 years.  Reviewed imaging independently.  Questionable infiltrate lateral view.  Formal radiology overread pending.  Discussed this with patient.  Will call patient at [phone] if radiology overread differs enough from mine and we need to change management.   Reviewed radiology report.  No acute cardiopulmonary disease.  See radiology  report for full details.  Discussed negative chest x-ray with patient while in department.  On reevaluation, patient states she feels better.  Chest tightness has significantly improved.  Improved air movement.  Still no wheezing.  Concern for lower respiratory tract infection, secondary pneumonia even though there is nothing obvious on chest x-ray due to double sickening/acute worsening after 3 weeks.  Will send home with Tussionex as she states Promethazine DM triggers intense anxiety.  Amoxicillin 1 g 3 times a day for 5 days for possible pneumonia as she is otherwise young, healthy, and has no comorbidities.  2 puffs from an albuterol  inhaler on a regularly scheduled basis for 4 days, and then as needed thereafter.  Prednisone  40 mg for 5 days.  Discussed imaging, MDM, treatment plan, and plan for follow-up with patient Discussed sn/sx that should prompt return to the ED. patient agrees with plan.   Meds ordered this encounter   Medications   ipratropium-albuterol  (DUONEB) 0.5-2.5 (3) MG/3ML nebulizer solution 3 mL   albuterol  (VENTOLIN  HFA) 108 (90 Base) MCG/ACT inhaler    Sig: Inhale 2 puffs into the lungs every 4 (four) hours as needed for wheezing or shortness of breath. Dispense with aerochamber    Dispense:  1 each    Refill:  0   azithromycin  (ZITHROMAX ) 500 MG tablet    Sig: Take 1 tablet (500 mg total) by mouth daily for 5 days.    Dispense:  5 tablet    Refill:  0   Spacer/Aero-Holding Chambers (AEROCHAMBER MV) inhaler    Sig: Use as instructed    Dispense:  1 each    Refill:  1   chlorpheniramine-HYDROcodone (TUSSIONEX) 10-8 MG/5ML    Sig: Take 5 mLs by mouth every 12 (twelve) hours as needed for cough.    Dispense:  60 mL    Refill:  0   predniSONE  (DELTASONE ) 20 MG tablet    Sig: Take 2 tablets (40 mg total) by mouth daily with breakfast for 5 days.    Dispense:  10 tablet    Refill:  0      *This clinic note was created using Scientist, clinical (histocompatibility and immunogenetics). Therefore, there may be occasional mistakes despite careful proofreading. ?    Van Knee, MD 04/28/24 [phone]

## 2024-09-19 ENCOUNTER — Encounter: Payer: Self-pay | Admitting: Dermatology

## 2024-09-19 ENCOUNTER — Ambulatory Visit: Payer: BC Managed Care – PPO | Admitting: Dermatology

## 2024-09-19 DIAGNOSIS — Z79899 Other long term (current) drug therapy: Secondary | ICD-10-CM

## 2024-09-19 DIAGNOSIS — L578 Other skin changes due to chronic exposure to nonionizing radiation: Secondary | ICD-10-CM | POA: Diagnosis not present

## 2024-09-19 DIAGNOSIS — L821 Other seborrheic keratosis: Secondary | ICD-10-CM

## 2024-09-19 DIAGNOSIS — W908XXA Exposure to other nonionizing radiation, initial encounter: Secondary | ICD-10-CM

## 2024-09-19 DIAGNOSIS — Z86018 Personal history of other benign neoplasm: Secondary | ICD-10-CM | POA: Diagnosis not present

## 2024-09-19 DIAGNOSIS — D489 Neoplasm of uncertain behavior, unspecified: Secondary | ICD-10-CM

## 2024-09-19 DIAGNOSIS — D229 Melanocytic nevi, unspecified: Secondary | ICD-10-CM

## 2024-09-19 DIAGNOSIS — L700 Acne vulgaris: Secondary | ICD-10-CM | POA: Diagnosis not present

## 2024-09-19 DIAGNOSIS — D235 Other benign neoplasm of skin of trunk: Secondary | ICD-10-CM

## 2024-09-19 DIAGNOSIS — L814 Other melanin hyperpigmentation: Secondary | ICD-10-CM

## 2024-09-19 DIAGNOSIS — Z1283 Encounter for screening for malignant neoplasm of skin: Secondary | ICD-10-CM | POA: Diagnosis not present

## 2024-09-19 DIAGNOSIS — B360 Pityriasis versicolor: Secondary | ICD-10-CM | POA: Diagnosis not present

## 2024-09-19 DIAGNOSIS — Z7189 Other specified counseling: Secondary | ICD-10-CM

## 2024-09-19 DIAGNOSIS — D1801 Hemangioma of skin and subcutaneous tissue: Secondary | ICD-10-CM

## 2024-09-19 MED ORDER — KETOCONAZOLE 2 % EX SHAM: MEDICATED_SHAMPOO | CUTANEOUS | 11 refills | Status: AC

## 2024-09-19 MED ORDER — TRETINOIN 0.05 % EX CREA: TOPICAL_CREAM | Freq: Every day | CUTANEOUS | 11 refills | Status: AC

## 2024-09-19 NOTE — Patient Instructions (Addendum)
 Biopsy Wound Care Instructions  Leave the original bandage on for 24 hours if possible.  If the bandage becomes soaked or soiled before that time, it is OK to remove it and examine the wound.  A small amount of post-operative bleeding is normal.  If excessive bleeding occurs, remove the bandage, place gauze over the site and apply continuous pressure (no peeking) over the area for 30 minutes. If this does not work, please call our clinic as soon as possible or page your doctor if it is after hours.   Once a day, cleanse the wound with soap and water. It is fine to shower. If a thick crust develops you may use a Q-tip dipped into dilute hydrogen peroxide (mix 1:1 with water) to dissolve it.  Hydrogen peroxide can slow the healing process, so use it only as needed.    After washing, apply petroleum jelly (Vaseline) or an antibiotic ointment if your doctor prescribed one for you, followed by a bandage.    For best healing, the wound should be covered with a layer of ointment at all times. If you are not able to keep the area covered with a bandage to hold the ointment in place, this may mean re-applying the ointment several times a day.  Continue this wound care until the wound has healed and is no longer open.   Itching and mild discomfort is normal during the healing process. However, if you develop pain or severe itching, please call our office.   If you have any discomfort, you can take Tylenol (acetaminophen) or ibuprofen as directed on the bottle. (Please do not take these if you have an allergy to them or cannot take them for another reason).  Some redness, tenderness and white or yellow material in the wound is normal healing.  If the area becomes very sore and red, or develops a thick yellow-green material (pus), it may be infected; please notify us .    If you have stitches, return to clinic as directed to have the stitches removed. You will continue wound care for 2-3 days after the stitches  are removed.   Wound healing continues for up to one year following surgery. It is not unusual to experience pain in the scar from time to time during the interval.  If the pain becomes severe or the scar thickens, you should notify the office.    A slight amount of redness in a scar is expected for the first six months.  After six months, the redness will fade and the scar will soften and fade.  The color difference becomes less noticeable with time.  If there are any problems, return for a post-op surgery check at your earliest convenience.  To improve the appearance of the scar, you can use silicone scar gel, cream, or sheets (such as Mederma or Serica) every night for up to one year. These are available over the counter (without a prescription).  Please call our office at [phone] for any questions or concerns.       Melanoma ABCDEs  Melanoma is the most dangerous type of skin cancer, and is the leading cause of death from skin disease.  You are more likely to develop melanoma if you: Have light-colored skin, light-colored eyes, or red or blond hair Spend a lot of time in the sun Tan regularly, either outdoors or in a tanning bed Have had blistering sunburns, especially during childhood Have a close family member who has had a melanoma Have atypical moles  or large birthmarks  Early detection of melanoma is key since treatment is typically straightforward and cure rates are extremely high if we catch it early.   The first sign of melanoma is often a change in a mole or a new dark spot.  The ABCDE system is a way of remembering the signs of melanoma.  A for asymmetry:  The two halves do not match. B for border:  The edges of the growth are irregular. C for color:  A mixture of colors are present instead of an even brown color. D for diameter:  Melanomas are usually (but not always) greater than 6mm - the size of a pencil eraser. E for evolution:  The spot keeps changing in  size, shape, and color.  Please check your skin once per month between visits. You can use a small mirror in front and a large mirror behind you to keep an eye on the back side or your body.   If you see any new or changing lesions before your next follow-up, please call to schedule a visit.  Please continue daily skin protection including broad spectrum sunscreen SPF 30+ to sun-exposed areas, reapplying every 2 hours as needed when you're outdoors.   Staying in the shade or wearing long sleeves, sun glasses (UVA+UVB protection) and wide brim hats (4-inch brim around the entire circumference of the hat) are also recommended for sun protection.     Due to recent changes in healthcare laws, you may see results of your pathology and/or laboratory studies on MyChart before the doctors have had a chance to review them. We understand that in some cases there may be results that are confusing or concerning to you. Please understand that not all results are received at the same time and often the doctors may need to interpret multiple results in order to provide you with the best plan of care or course of treatment. Therefore, we ask that you please give us  2 business days to thoroughly review all your results before contacting the office for clarification. Should we see a critical lab result, you will be contacted sooner.   If You Need Anything After Your Visit  If you have any questions or concerns for your doctor, please call our main line at [phone] and press option 4 to reach your doctor's medical assistant. If no one answers, please leave a voicemail as directed and we will return your call as soon as possible. Messages left after 4 pm will be answered the following business day.   You may also send us  a message via MyChart. We typically respond to MyChart messages within 1-2 business days.  For prescription refills, please ask your pharmacy to contact our office. Our fax number is  [phone].  If you have an urgent issue when the clinic is closed that cannot wait until the next business day, you can page your doctor at the number below.    Please note that while we do our best to be available for urgent issues outside of office hours, we are not available 24/7.   If you have an urgent issue and are unable to reach us , you may choose to seek medical care at your doctor's office, retail clinic, urgent care center, or emergency room.  If you have a medical emergency, please immediately call 911 or go to the emergency department.  Pager Numbers  - Dr. Hester: [phone]  - Dr. Jackquline: [phone]  - Dr. Claudene: [phone]   - Dr. Raymund:  [phone]  In the event of inclement weather, please call our main line at [phone] for an update on the status of any delays or closures.  Dermatology Medication Tips: Please keep the boxes that topical medications come in in order to help keep track of the instructions about where and how to use these. Pharmacies typically print the medication instructions only on the boxes and not directly on the medication tubes.   If your medication is too expensive, please contact our office at [phone] option 4 or send us  a message through MyChart.   We are unable to tell what your co-pay for medications will be in advance as this is different depending on your insurance coverage. However, we may be able to find a substitute medication at lower cost or fill out paperwork to get insurance to cover a needed medication.   If a prior authorization is required to get your medication covered by your insurance company, please allow us  1-2 business days to complete this process.  Drug prices often vary depending on where the prescription is filled and some pharmacies may offer cheaper prices.  The website www.goodrx.com contains coupons for medications through different pharmacies. The prices here do not account for what the cost  may be with help from insurance (it may be cheaper with your insurance), but the website can give you the price if you did not use any insurance.  - You can print the associated coupon and take it with your prescription to the pharmacy.  - You may also stop by our office during regular business hours and pick up a GoodRx coupon card.  - If you need your prescription sent electronically to a different pharmacy, notify our office through [facility] or by phone at [phone] option 4.     Si Usted Necesita Algo Despus de Su Visita  Tambin puede enviarnos un mensaje a travs de Clinical cytogeneticist. Por lo general respondemos a los mensajes de MyChart en el transcurso de 1 a 2 das hbiles.  Para renovar recetas, por favor pida a su farmacia que se ponga en contacto con nuestra oficina. Randi lakes de fax es [place] [phone].  Si tiene un asunto urgente cuando la clnica est cerrada y que no puede esperar hasta el siguiente da hbil, puede llamar/localizar a su doctor(a) al nmero que aparece a continuacin.   Por favor, tenga en cuenta que aunque hacemos todo lo posible para estar disponibles para asuntos urgentes fuera del horario de [place], no estamos disponibles las 24 horas del da, los 7 [address]  [address] de la [place].   Si tiene un problema urgente y no puede comunicarse con nosotros, puede optar por buscar atencin mdica  en el consultorio de su doctor(a), en una clnica privada, en un centro de atencin urgente o en una sala de emergencias.  Si tiene Engineer, drilling, por favor llame inmediatamente al 911 o vaya a la sala de emergencias.  Nmeros de bper  - Dr. Hester: [phone]  - Dra. Jackquline: [phone]  - Dr. Claudene: [phone]  - Dra. Kitts: [phone]  En caso de inclemencias del [place], por favor llame a nuestra lnea principal al [phone] para una actualizacin sobre el estado de cualquier retraso o cierre.  Consejos para la medicacin en dermatologa: Por  favor, guarde las cajas en las que vienen los medicamentos de uso tpico para ayudarle a seguir las instrucciones sobre dnde y cmo usarlos. Las farmacias generalmente imprimen las instrucciones del medicamento slo en las cajas y no directamente  en los tubos del medicamento.   Si su medicamento es muy caro, por favor, pngase en contacto con landry rieger llamando al [phone] y presione la opcin 4 o envenos un mensaje a travs de Clinical cytogeneticist.   No podemos decirle cul ser su copago por los medicamentos por adelantado ya que esto es diferente dependiendo de la cobertura de su seguro. Sin embargo, es posible que podamos encontrar un medicamento sustituto a Audiological scientist un formulario para que el seguro cubra el medicamento que se considera necesario.   Si se requiere una autorizacin previa para que su compaa de seguros malta su medicamento, por favor permtanos de 1 a 2 das hbiles para completar este proceso.  Los precios de los medicamentos varan con frecuencia dependiendo del Environmental consultant de dnde se surte la receta y alguna farmacias pueden ofrecer precios ms baratos.  El sitio web www.goodrx.com tiene cupones para medicamentos de Health and safety inspector. Los precios aqu no tienen en cuenta lo que podra costar con la ayuda del seguro (puede ser ms barato con su seguro), pero el sitio web puede darle el precio si no utiliz Tourist information centre manager.  - Puede imprimir el cupn correspondiente y llevarlo con su receta a la farmacia.  - Tambin puede pasar por nuestra oficina durante el horario de atencin regular y Education officer, museum una tarjeta de cupones de GoodRx.  - Si necesita que su receta se enve electrnicamente a una farmacia diferente, informe a nuestra oficina a travs de MyChart de [place] o por telfono llamando al [phone] y presione la opcin 4.

## 2024-09-19 NOTE — Progress Notes (Signed)
 "  Follow-Up Visit   Subjective  Jacqueline Pruitt is a 24 y.o. female who presents for the following: Skin Cancer Screening and Full Body Skin Exam Hx of dysplastic nevi Hx of tinea versicolor  Hx of acne Has improved On dapsone  and tretinoin  0.05 cream   Patient reports at right upper chest color changed   The patient presents for Total-Body Skin Exam (TBSE) for skin cancer screening and mole check. The patient has spots, moles and lesions to be evaluated, some may be new or changing and the patient may have concern these could be cancer.  The following portions of the chart were reviewed this encounter and updated as appropriate: medications, allergies, medical history  Review of Systems:  No other skin or systemic complaints except as noted in HPI or Assessment and Plan.  Objective  Well appearing patient in no apparent distress; mood and affect are within normal limits.  A full examination was performed including scalp, head, eyes, ears, nose, lips, neck, chest, axillae, abdomen, back, buttocks, bilateral upper extremities, bilateral lower extremities, hands, feet, fingers, toes, fingernails, and toenails. All findings within normal limits unless otherwise noted below.   Relevant physical exam findings are noted in the Assessment and Plan.  right chest parasternal 0.6 cm brown macule   left superior breast 0.6 cm brown macule    Assessment & Plan   HISTORY OF DYSPLASTIC NEVI -08/31/2023 right superior lateral buttock at the lateral waistline - moderate  08/31/2022 - left epigastric mild  - Right mid lateral back at side mild  No evidence of recurrence today Recommend regular full body skin exams Recommend daily broad spectrum sunscreen SPF 30+ to sun-exposed areas, reapply every 2 hours as needed.  Call if any new or changing lesions are noted between office visits  SKIN CANCER SCREENING PERFORMED TODAY.  ACTINIC DAMAGE - Chronic condition, secondary to cumulative  UV/sun exposure - diffuse scaly erythematous macules with underlying dyspigmentation - Recommend daily broad spectrum sunscreen SPF 30+ to sun-exposed areas, reapply every 2 hours as needed.  - Staying in the shade or wearing long sleeves, sun glasses (UVA+UVB protection) and wide brim hats (4-inch brim around the entire circumference of the hat) are also recommended for sun protection.  - Call for new or changing lesions.  LENTIGINES, SEBORRHEIC KERATOSES, HEMANGIOMAS - Benign normal skin lesions - Benign-appearing - Call for any changes  MELANOCYTIC NEVI - Tan-brown and/or pink-flesh-colored symmetric macules and papules - Benign appearing on exam today - Observation - Call clinic for new or changing moles - Recommend daily use of broad spectrum spf 30+ sunscreen to sun-exposed areas.   ACNE VULGARIS - persistent but improved Exam: Open comedones and inflammatory papules of the face 2 active papules at today face and chin Chronic and persistent condition with duration or expected duration over one year. Condition is improving with treatment but not currently at goal. Treatment Plan: Recommend to continue treatment  Continue Tretinoin  0.05% cream QHS  Discussed if want to be a more aggressive treatment such as Epiduo Forte cream in the future Stop Dapsone  7.5% gel QAM  Tinea Versicolor Exam : Multiple dyspigmented macules and patches with fine scale. Chronic and persistent condition with duration or expected duration over one year. Condition is symptomatic/ bothersome to patient. Not currently at goal. Apply Ketoconazole  cream twice a day as needed until clear. Advised it can take many months for affected areas to repigment.  Use ketoconazole  shampoo as body wash once a week, especially in summer  months   Tinea versicolor is a chronic recurrent skin rash causing discolored scaly spots most commonly seen on back, chest, and/or shoulders.  It is generally asymptomatic. The rash is due  to overgrowth of a common type of yeast present on everyone's skin and it is not contagious.  It tends to flare more in the summer due to increased sweating on trunk.  After rash is treated, the scaliness will resolve, but the discoloration will take longer to return to normal pigmentation. The periodic use of an OTC medicated soap/shampoo with zinc or selenium sulfide can be helpful to prevent yeast overgrowth and recurrence.  Plan:  Continue Ketoconazole  2% shampoo - wash trunk 2-3 times per week for 2 months then monthly for prevention.  ACNE VULGARIS   This Visit - tretinoin  (RETIN-A ) 0.05 % cream - Apply topically at bedtime. Existing Treatments - Dapsone  7.5 % GEL - Apply 1 application  topically daily. TINEA VERSICOLOR   This Visit - ketoconazole  (NIZORAL ) 2 % shampoo - Use topically as body wash to trunk 2 - 3 days weekly lather on skin let sit a few minutes and then rinse off for tinea versicolor NEOPLASM OF UNCERTAIN BEHAVIOR (2) right chest parasternal - Epidermal / dermal shaving  Lesion diameter (cm):  0.6 Informed consent: discussed and consent obtained   Timeout: patient name, date of birth, surgical site, and procedure verified   Procedure prep:  Patient was prepped and draped in usual sterile fashion Prep type:  Isopropyl alcohol Anesthesia: the lesion was anesthetized in a standard fashion   Anesthetic:  1% lidocaine w/ epinephrine 1-100,000 buffered w/ 8.4% NaHCO3 Instrument used: flexible razor blade   Hemostasis achieved with: pressure, aluminum chloride and electrodesiccation   Outcome: patient tolerated procedure well   Post-procedure details: sterile dressing applied and wound care instructions given   Dressing type: bandage and petrolatum    Specimen 1 - Surgical pathology Differential Diagnosis: nevus r/o dysplasia   Check Margins: yes left superior breast - Epidermal / dermal shaving  Lesion diameter (cm):  0.6 Informed consent: discussed and  consent obtained   Timeout: patient name, date of birth, surgical site, and procedure verified   Procedure prep:  Patient was prepped and draped in usual sterile fashion Prep type:  Isopropyl alcohol Anesthesia: the lesion was anesthetized in a standard fashion   Anesthetic:  1% lidocaine w/ epinephrine 1-100,000 buffered w/ 8.4% NaHCO3 Instrument used: flexible razor blade   Hemostasis achieved with: pressure, aluminum chloride and electrodesiccation   Outcome: patient tolerated procedure well   Post-procedure details: sterile dressing applied and wound care instructions given   Dressing type: bandage and petrolatum    Specimen 2 - Surgical pathology Differential Diagnosis: nevus r/o dysplasia   Check Margins: yes Nevus r/o dysplasia   Discussed has a potential for forming a hypertrophic scar after biopsy  Return in about 1 year (around 09/19/2025) for TBSE.  IEleanor Blush, CMA, am acting as scribe for Alm Rhyme, MD.   Documentation: I have reviewed the above documentation for accuracy and completeness, and I agree with the above.  Alm Rhyme, MD    "

## 2024-09-23 ENCOUNTER — Telehealth: Payer: Self-pay

## 2024-09-23 LAB — SURGICAL PATHOLOGY

## 2024-09-23 NOTE — Telephone Encounter (Signed)
 [facility] has called. The specimen bottle for R chest was empty.  Jacqueline Pruitt can be called back at [phone]

## 2024-09-23 NOTE — Telephone Encounter (Signed)
 [facility] advised of information per Dr. Hester. aw

## 2024-09-24 ENCOUNTER — Ambulatory Visit: Payer: Self-pay | Admitting: Dermatology

## 2024-09-24 ENCOUNTER — Encounter: Payer: Self-pay | Admitting: Dermatology

## 2024-09-24 NOTE — Telephone Encounter (Addendum)
 Called and discussed results with patient. She verbalized understanding and denied further questions. Will recheck areas at next followup.   ----- Message from Alm Rhyme, MD sent at 09/24/2024 11:19 AM EST ----- FINAL DIAGNOSIS        1. Skin, right chest parasternal :       SEE DESCRIPTION.        2. Skin, left superior breast :       DYSPLASTIC NEVUS WITH MILD ATYPIA, ADDITIONAL DYSPLASTIC NEVUS WITH MILD ATYPIA,       PERIPHERAL AND DEEP MARGINS INVOLVED, SEE DESCRIPTION.   1&2 - Both Mild Dysplastic Recheck both next visit

## 2025-09-23 ENCOUNTER — Ambulatory Visit: Admitting: Dermatology
